# Patient Record
Sex: Male | Born: 1990 | Race: Black or African American | Hispanic: No | Marital: Single
Health system: Southern US, Community
[De-identification: ages and names within clinical notes are randomized; demographics above are authoritative.]

---

## 2022-02-26 ENCOUNTER — Emergency Department (HOSPITAL_COMMUNITY): Payer: Self-pay

## 2022-02-26 ENCOUNTER — Inpatient Hospital Stay (HOSPITAL_COMMUNITY)
Admission: EM | Admit: 2022-02-26 | Discharge: 2022-02-28 | DRG: 057 | Disposition: A | Discharge status: Home / Self Care | Payer: Self-pay | Attending: General Surgery | Admitting: General Surgery

## 2022-02-26 ENCOUNTER — Other Ambulatory Visit: Payer: Self-pay

## 2022-02-26 DIAGNOSIS — Y92410 Unspecified street and highway as the place of occurrence of the external cause: Secondary | ICD-10-CM | POA: Diagnosis not present

## 2022-02-26 DIAGNOSIS — G8194 Hemiplegia, unspecified affecting left nondominant side: Secondary | ICD-10-CM | POA: Diagnosis not present

## 2022-02-26 DIAGNOSIS — F449 Dissociative and conversion disorder, unspecified: Secondary | ICD-10-CM | POA: Diagnosis present

## 2022-02-26 DIAGNOSIS — S32039A Unspecified fracture of third lumbar vertebra, initial encounter for closed fracture: Secondary | ICD-10-CM | POA: Diagnosis present

## 2022-02-26 DIAGNOSIS — S14109A Unspecified injury at unspecified level of cervical spinal cord, initial encounter: Secondary | ICD-10-CM | POA: Diagnosis present

## 2022-02-26 DIAGNOSIS — S134XXA Sprain of ligaments of cervical spine, initial encounter: Secondary | ICD-10-CM | POA: Diagnosis present

## 2022-02-26 DIAGNOSIS — S32029A Unspecified fracture of second lumbar vertebra, initial encounter for closed fracture: Secondary | ICD-10-CM | POA: Diagnosis present

## 2022-02-26 LAB — COMPREHENSIVE METABOLIC PANEL
ALT: 25 U/L (ref 0–44)
AST: 22 U/L (ref 15–41)
Albumin: 3.8 g/dL (ref 3.5–5.0)
Alkaline Phosphatase: 74 U/L (ref 38–126)
Anion gap: 10 (ref 5–15)
BUN: 11 mg/dL (ref 6–20)
CO2: 22 mmol/L (ref 22–32)
Calcium: 8.2 mg/dL — ABNORMAL LOW (ref 8.9–10.3)
Chloride: 112 mmol/L — ABNORMAL HIGH (ref 98–111)
Creatinine, Ser: 1.09 mg/dL (ref 0.61–1.24)
GFR, Estimated: >60 mL/min (ref >60)
Glucose, Bld: 108 mg/dL — ABNORMAL HIGH (ref 70–99)
Potassium: 3.2 mmol/L — ABNORMAL LOW (ref 3.5–5.1)
Sodium: 144 mmol/L (ref 135–145)
Total Bilirubin: 0.7 mg/dL (ref 0.3–1.2)
Total Protein: 6.5 g/dL (ref 6.5–8.1)

## 2022-02-26 LAB — CBC
HCT: 41.9 % (ref 39.0–52.0)
Hemoglobin: 14 g/dL (ref 13.0–17.0)
MCH: 29.4 pg (ref 26.0–34.0)
MCHC: 33.4 g/dL (ref 30.0–36.0)
MCV: 88 fL (ref 80.0–100.0)
Platelets: 305 10*3/uL (ref 150–400)
RBC: 4.76 MIL/uL (ref 4.22–5.81)
RDW: 12.5 % (ref 11.5–15.5)
WBC: 6.4 10*3/uL (ref 4.0–10.5)
nRBC: 0 % (ref 0.0–0.2)

## 2022-02-26 LAB — ETHANOL: Alcohol, Ethyl (B): <10 mg/dL (ref <10)

## 2022-02-26 LAB — SAMPLE TO BLOOD BANK

## 2022-02-26 LAB — I-STAT CHEM 8, ED
BUN: 11 mg/dL (ref 6–20)
Calcium, Ion: 0.95 mmol/L — ABNORMAL LOW (ref 1.15–1.40)
Chloride: 104 mmol/L (ref 98–111)
Creatinine, Ser: 1.2 mg/dL (ref 0.61–1.24)
Glucose, Bld: 116 mg/dL — ABNORMAL HIGH (ref 70–99)
HCT: 42 % (ref 39.0–52.0)
Hemoglobin: 14.3 g/dL (ref 13.0–17.0)
Potassium: 3.4 mmol/L — ABNORMAL LOW (ref 3.5–5.1)
Sodium: 142 mmol/L (ref 135–145)
TCO2: 23 mmol/L (ref 22–32)

## 2022-02-26 LAB — LACTIC ACID, PLASMA: Lactic Acid, Venous: 1.2 mmol/L (ref 0.5–1.9)

## 2022-02-26 LAB — PROTIME-INR
INR: 1 (ref 0.8–1.2)
Prothrombin Time: 13.6 seconds (ref 11.4–15.2)

## 2022-02-26 LAB — HIV ANTIBODY (ROUTINE TESTING W REFLEX): HIV Screen 4th Generation wRfx: NONREACTIVE
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
## 2022-02-26 MED ORDER — DEXTROSE 5 % IV SOLN
1000.0000 mg | Freq: Three times a day (TID) | INTRAVENOUS | Status: DC | PRN
Start: 2022-02-26 — End: 2022-02-28
  Filled 2022-02-26: qty 10

## 2022-02-26 MED ORDER — PANTOPRAZOLE SODIUM 40 MG PO TBEC
40.0000 mg | DELAYED_RELEASE_TABLET | Freq: Every day | ORAL | Status: DC
  Administered 2022-02-28: 40 mg via ORAL
  Filled 2022-02-26: qty 1

## 2022-02-26 MED ORDER — OXYCODONE HCL 5 MG PO TABS
10.0000 mg | ORAL_TABLET | ORAL | Status: DC | PRN
  Administered 2022-02-27: 10 mg via ORAL
  Filled 2022-02-26: qty 2

## 2022-02-26 MED ORDER — GABAPENTIN 100 MG PO CAPS
100.0000 mg | ORAL_CAPSULE | Freq: Three times a day (TID) | ORAL | Status: DC
  Administered 2022-02-27 – 2022-02-28 (×4): 100 mg via ORAL
  Filled 2022-02-26 (×4): qty 1

## 2022-02-26 MED ORDER — IOHEXOL 350 MG/ML SOLN
100.0000 mL | Freq: Once | INTRAVENOUS | Status: AC | PRN
  Administered 2022-02-26: 100 mL via INTRAVENOUS

## 2022-02-26 MED ORDER — MORPHINE SULFATE (PF) 4 MG/ML IV SOLN
4.0000 mg | INTRAVENOUS | Status: DC | PRN
  Administered 2022-02-27: 4 mg via INTRAVENOUS
  Filled 2022-02-26: qty 1

## 2022-02-26 MED ORDER — ONDANSETRON HCL 4 MG/2ML IJ SOLN
4.0000 mg | Freq: Four times a day (QID) | INTRAMUSCULAR | Status: DC | PRN
  Administered 2022-02-27: 4 mg via INTRAVENOUS
  Filled 2022-02-26: qty 2

## 2022-02-26 MED ORDER — PANTOPRAZOLE SODIUM 40 MG IV SOLR
40.0000 mg | Freq: Every day | INTRAVENOUS | Status: DC
  Administered 2022-02-27: 40 mg via INTRAVENOUS
  Filled 2022-02-26: qty 10

## 2022-02-26 MED ORDER — OXYCODONE HCL 5 MG PO TABS
5.0000 mg | ORAL_TABLET | ORAL | Status: DC | PRN
  Administered 2022-02-28: 5 mg via ORAL
  Filled 2022-02-26: qty 1

## 2022-02-26 MED ORDER — ACETAMINOPHEN 325 MG PO TABS
650.0000 mg | ORAL_TABLET | ORAL | Status: DC | PRN
  Administered 2022-02-27 – 2022-02-28 (×3): 650 mg via ORAL
  Filled 2022-02-26 (×3): qty 2

## 2022-02-26 MED ORDER — ONDANSETRON 4 MG PO TBDP: 4.0000 mg | ORAL_TABLET | Freq: Four times a day (QID) | ORAL | Status: DC | PRN

## 2022-02-26 MED ORDER — LACTATED RINGERS IV SOLN
INTRAVENOUS | Status: DC
Start: 2022-02-26 — End: 2022-02-27
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
## 2022-02-26 NOTE — ED Notes (Signed)
Spoke to MRI technician and pt is next in queue
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
## 2022-02-26 NOTE — ED Notes (Signed)
Pt going to CT and MRI

## 2022-02-27 LAB — BASIC METABOLIC PANEL
Anion gap: 6 (ref 5–15)
BUN: 11 mg/dL (ref 6–20)
CO2: 26 mmol/L (ref 22–32)
Calcium: 9 mg/dL (ref 8.9–10.3)
Chloride: 107 mmol/L (ref 98–111)
Creatinine, Ser: 1.23 mg/dL (ref 0.61–1.24)
GFR, Estimated: >60 mL/min (ref >60)
Glucose, Bld: 103 mg/dL — ABNORMAL HIGH (ref 70–99)
Potassium: 3.7 mmol/L (ref 3.5–5.1)
Sodium: 139 mmol/L (ref 135–145)

## 2022-02-27 LAB — CBC
HCT: 41.7 % (ref 39.0–52.0)
Hemoglobin: 14.3 g/dL (ref 13.0–17.0)
MCH: 29.9 pg (ref 26.0–34.0)
MCHC: 34.3 g/dL (ref 30.0–36.0)
MCV: 87.1 fL (ref 80.0–100.0)
Platelets: 289 10*3/uL (ref 150–400)
RBC: 4.79 MIL/uL (ref 4.22–5.81)
RDW: 12.9 % (ref 11.5–15.5)
WBC: 6.4 10*3/uL (ref 4.0–10.5)
nRBC: 0 % (ref 0.0–0.2)
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
## 2022-02-27 NOTE — ED Notes (Signed)
Pt able to speak to mother on phone and give her an update, pt still waiting on neurosurgeon at this time. Pt voiced no needs at current. Reports left side is felling slightly better and able to show some movement.
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
## 2022-02-27 NOTE — ED Notes (Signed)
Pt appears to be sleeping, even RR and unlabored, NAD noted, call bell in reach, side rails up x2 for safety, care on going, will continue to monitor. 

## 2022-02-27 NOTE — ED Notes (Signed)
Transferred pt to hospital bed from stretcher for pt comfort. Pt was able to stand and take a few steps to the bed

## 2022-02-27 NOTE — ED Notes (Signed)
Pt remains in L-spine and C-spine precautions, currently still waiting on neuro surgeon to come assess pt

## 2022-02-27 NOTE — ED Notes (Signed)
Pt ambulating utilizing a walker in the hallway with physical therapist

## 2022-02-27 NOTE — ED Notes (Signed)
Pt was able to use a urinal to void, pt refusing urinary catheter

## 2022-02-27 NOTE — ED Notes (Signed)
C-collar removed by Dr Danielle Dess.
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
## 2022-02-27 NOTE — ED Notes (Signed)
Dr. Elsner at bedside. 

## 2022-02-27 NOTE — ED Notes (Signed)
Joni Reining (Mother) would like to be called and updated on pt status. Phone is [phone]

## 2022-02-28 ENCOUNTER — Other Ambulatory Visit (HOSPITAL_COMMUNITY): Payer: Self-pay

## 2022-02-28 LAB — CBC
HCT: 39.9 % (ref 39.0–52.0)
Hemoglobin: 13.5 g/dL (ref 13.0–17.0)
MCH: 30.1 pg (ref 26.0–34.0)
MCHC: 33.8 g/dL (ref 30.0–36.0)
MCV: 88.9 fL (ref 80.0–100.0)
Platelets: 285 10*3/uL (ref 150–400)
RBC: 4.49 MIL/uL (ref 4.22–5.81)
RDW: 12.6 % (ref 11.5–15.5)
WBC: 5.5 10*3/uL (ref 4.0–10.5)
nRBC: 0 % (ref 0.0–0.2)

## 2022-02-28 LAB — BASIC METABOLIC PANEL
Anion gap: 7 (ref 5–15)
BUN: 15 mg/dL (ref 6–20)
CO2: 28 mmol/L (ref 22–32)
Calcium: 8.7 mg/dL — ABNORMAL LOW (ref 8.9–10.3)
Chloride: 104 mmol/L (ref 98–111)
Creatinine, Ser: 1.18 mg/dL (ref 0.61–1.24)
GFR, Estimated: >60 mL/min (ref >60)
Glucose, Bld: 106 mg/dL — ABNORMAL HIGH (ref 70–99)
Potassium: 3.6 mmol/L (ref 3.5–5.1)
Sodium: 139 mmol/L (ref 135–145)

## 2022-02-28 MED ORDER — METHOCARBAMOL 500 MG PO TABS
500.0000 mg | ORAL_TABLET | Freq: Four times a day (QID) | ORAL | 0 refills | Status: AC | PRN
  Filled 2022-02-28: qty 30, 8d supply, fill #0

## 2022-02-28 MED ORDER — METHOCARBAMOL 500 MG PO TABS: 500.0000 mg | ORAL_TABLET | Freq: Three times a day (TID) | ORAL | Status: DC

## 2022-02-28 MED ORDER — METHOCARBAMOL 500 MG PO TABS
500.0000 mg | ORAL_TABLET | Freq: Three times a day (TID) | ORAL | Status: DC
  Administered 2022-02-28 (×2): 500 mg via ORAL
  Filled 2022-02-28 (×2): qty 1

## 2022-02-28 MED ORDER — ACETAMINOPHEN 325 MG PO TABS: 650.0000 mg | ORAL_TABLET | Freq: Four times a day (QID) | ORAL | Status: AC | PRN

## 2022-02-28 MED ORDER — ENOXAPARIN SODIUM 30 MG/0.3ML IJ SOSY
30.0000 mg | PREFILLED_SYRINGE | Freq: Two times a day (BID) | INTRAMUSCULAR | Status: DC
  Administered 2022-02-28: 30 mg via SUBCUTANEOUS
  Filled 2022-02-28: qty 0.3

## 2022-02-28 MED ORDER — OXYCODONE HCL 5 MG PO TABS
5.0000 mg | ORAL_TABLET | Freq: Four times a day (QID) | ORAL | 0 refills | Status: AC | PRN
  Filled 2022-02-28: qty 15, 4d supply, fill #0

## 2022-02-28 MED ORDER — POTASSIUM CHLORIDE CRYS ER 20 MEQ PO TBCR
40.0000 meq | EXTENDED_RELEASE_TABLET | Freq: Once | ORAL | Status: AC
  Administered 2022-02-28: 40 meq via ORAL
  Filled 2022-02-28: qty 2
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
